# Patient Record
Sex: Male | Born: 1990 | Race: White | Hispanic: No | Marital: Single
Health system: Southern US, Academic
[De-identification: ages and names within clinical notes are randomized; demographics above are authoritative.]

## PROBLEM LIST (undated history)

## (undated) HISTORY — PX: HX EAR TUBES: 2100001170

## (undated) HISTORY — PX: TONSILLECTOMY: SUR1361

---

## 2011-07-02 ENCOUNTER — Emergency Department (HOSPITAL_BASED_OUTPATIENT_CLINIC_OR_DEPARTMENT_OTHER)
Admission: EM | Admit: 2011-07-02 | Discharge: 2011-07-02 | Disposition: A | Discharge status: Home / Self Care | Payer: Self-pay | Attending: Emergency Medicine | Admitting: Emergency Medicine

## 2011-07-02 ENCOUNTER — Encounter (HOSPITAL_BASED_OUTPATIENT_CLINIC_OR_DEPARTMENT_OTHER): Payer: Self-pay | Admitting: *Deleted

## 2011-07-02 DIAGNOSIS — R599 Enlarged lymph nodes, unspecified: Secondary | ICD-10-CM | POA: Insufficient documentation

## 2011-07-02 DIAGNOSIS — K120 Recurrent oral aphthae: Secondary | ICD-10-CM

## 2011-07-02 DIAGNOSIS — K1370 Unspecified lesions of oral mucosa: Secondary | ICD-10-CM | POA: Insufficient documentation

## 2011-07-02 MED ORDER — DEXAMETHASONE SODIUM PHOSPHATE 10 MG/ML IJ SOLN
10.0000 mg | Freq: Once | INTRAMUSCULAR | Status: AC
  Administered 2011-07-02: 10 mg via INTRAMUSCULAR
  Filled 2011-07-02: qty 1

## 2011-07-02 NOTE — ED Notes (Signed)
Pt has blisters on lips and in mouth.

## 2011-07-02 NOTE — Discharge Instructions (Signed)
Canker Sores  Canker sores are painful, open sores on the inside of the mouth and cheek. They may be white or yellow. The sores usually heal in 1 to 2 weeks. Women are more likely than men to have recurrent canker sores. CAUSES The cause of canker sores is not well understood. More than one cause is likely. Canker sores do not appear to be caused by certain types of germs (viruses or bacteria). Canker sores may be caused by:  An allergic reaction to certain foods.   Digestive problems.   Not having enough vitamin B12, folic acid, and iron.   Male sex hormones. Sores may come only during certain phases of a menstrual cycle. Often, there is improvement during pregnancy.   Genetics. Some people seem to inherit canker sore problems.  Emotional stress and injuries to the mouth may trigger outbreaks, but not cause them.  DIAGNOSIS Canker sores are diagnosed by exam.  TREATMENT  Patients who have frequent bouts of canker sores may have cultures taken of the sores, blood tests, or allergy tests. This helps determine if their sores are caused by a poor diet, an allergy, or some other preventable or treatable disease.   Vitamins may prevent recurrences or reduce the severity of canker sores in people with poor nutrition.   Numbing ointments can relieve pain. These are available in drug stores without a prescription.   Anti-inflammatory steroid mouth rinses or gels may be prescribed by your caregiver for severe sores.   Oral steroids may be prescribed if you have severe, recurrent canker sores. These strong medicines can cause many side effects and should be used only under the close direction of a dentist or physician.   Mouth rinses containing the antibiotic medicine may be prescribed. They may lessen symptoms and speed healing.  Healing usually happens in about 1 or 2 weeks with or without treatment. Certain antibiotic mouth rinses given to pregnant women and young children can permanently  stain teeth. Talk to your caregiver about your treatment. HOME CARE INSTRUCTIONS   Avoid foods that cause canker sores for you.   Avoid citrus juices, spicy or salty foods, and coffee until the sores are healed.   Use a soft-bristled toothbrush.   Chew your food carefully to avoid biting your cheek.   Apply topical numbing medicine to the sore to help relieve pain.   Apply a thin paste of baking soda and water to the sore to help heal the sore.   Only use mouth rinses or medicines for pain or discomfort as directed by your caregiver.  SEEK MEDICAL CARE IF:   Your symptoms are not better in 1 week.   Your sores are still present after 2 weeks.   Your sores are very painful.   You have trouble breathing or swallowing.   Your sores come back frequently.  Document Released: 07/11/2010 Document Revised: 03/05/2011 Document Reviewed: 07/11/2010 Exit[employer] Patient Information 2012 [employer]. 

## 2011-07-02 NOTE — ED Provider Notes (Signed)
History     CSN: [id number]  Arrival date & time 07/02/11  2140   First MD Initiated Contact with Patient 07/02/11 2233      Chief Complaint  Patient presents with  . Mouth Lesions    (Consider location/radiation/quality/duration/timing/severity/associated sxs/prior treatment) HPI Comments: Patient complains of lip and mouth lesions that are been ongoing for approximately the last 5 days.  Patient states that 5 days ago he was having fevers to 102 and had a sore throat was seen by a different emergency department and diagnosed with strep pharyngitis and placed on amoxicillin.  Patient's been taking his amoxicillin as prescribed.  Over the last 5 days she's continued to have fevers until today.  He still has a sore throat and has the burning sensation in his throat.  He's taking an over-the-counter acid reflux medicine at this time.  Patient has developed ulcers on his lips growth of his mouth and around his gums that are somewhat painful.  This is also to treat the patient's decreased by mouth intake.  Patient otherwise has had no chest pain, cough, difficulty breathing, nausea or vomiting.  Patient is a 21 y.o. male presenting with mouth sores. The history is provided by the patient. No language interpreter was used.  Mouth Lesions  The current episode started 3 to 5 days ago. Associated symptoms include mouth sores. Pertinent negatives include no fever, no abdominal pain, no nausea, no vomiting, no headaches, no cough, no rash, no eye discharge and no eye redness.    History reviewed. No pertinent past medical history.  Past Surgical History  Procedure Date  . Tonsillectomy     History reviewed. No pertinent family history.  History  Substance Use Topics  . Smoking status: Never Smoker   . Smokeless tobacco: Not on file  . Alcohol Use: No      Review of Systems  Constitutional: Negative.  Negative for fever and chills.  HENT: Positive for mouth sores.   Eyes: Negative.   Negative for discharge and redness.  Respiratory: Negative.  Negative for cough and shortness of breath.   Cardiovascular: Negative.  Negative for chest pain.  Gastrointestinal: Negative.  Negative for nausea, vomiting and abdominal pain.  Genitourinary: Negative.  Negative for hematuria.  Musculoskeletal: Negative.  Negative for back pain.  Skin: Negative.  Negative for color change and rash.  Neurological: Negative for syncope and headaches.  Hematological: Negative.  Negative for adenopathy.  Psychiatric/Behavioral: Negative.  Negative for confusion.  All other systems reviewed and are negative.    Allergies  Sulfa antibiotics  Home Medications   Current Outpatient Rx  Name Route Sig Dispense Refill  . MAGIC MOUTHWASH W/LIDOCAINE Oral Take 2 mLs by mouth 3 (three) times daily.    . AMOXICILLIN 875 MG PO TABS Oral Take 875 mg by mouth 2 (two) times daily.    Marland Kitchen BISMUTH SUBSALICYLATE 262 MG/15ML PO SUSP Oral Take 30 mLs by mouth every 6 (six) hours as needed. Patient used this medication for upset stomach.    . IBUPROFEN 200 MG PO TABS Oral Take 400 mg by mouth every 6 (six) hours as needed. Patient used this medication for fever.    Ronney Asters COLD PO Oral Take 10 mLs by mouth daily as needed. Patient was using this medication for cold symptoms.      BP 137/81  Pulse 93  Temp(Src) 98.3 F (36.8 C) (Oral)  Resp 18  Ht 6\' 2"  (1.88 m)  Wt 220 lb (99.791 kg)  BMI 28.25 kg/m2  SpO2 99%  Physical Exam  Nursing note and vitals reviewed. Constitutional: He is oriented to person, place, and time. He appears well-developed and well-nourished.  Non-toxic appearance. He does not have a sickly appearance.  HENT:  Head: Normocephalic and atraumatic.  Mouth/Throat: No oropharyngeal exudate.       Patient's upper and lower lips have crusted over areas of ulcers.  Around his front upper teeth there is some redness and ulcers along his palate.  Patient has an ulcer on his left buccal mucosa  and along his lower lip oral mucosa.  Eyes: Conjunctivae, EOM and lids are normal. Pupils are equal, round, and reactive to light.  Neck: Trachea normal, normal range of motion and full passive range of motion without pain. Neck supple.  Cardiovascular: Normal rate, regular rhythm and normal heart sounds.   Pulmonary/Chest: Effort normal and breath sounds normal. No respiratory distress.  Abdominal: Soft. Normal appearance. He exhibits no distension. There is no tenderness. There is no rebound and no CVA tenderness.  Musculoskeletal: Normal range of motion.  Lymphadenopathy:    He has cervical adenopathy.  Neurological: He is alert and oriented to person, place, and time. He has normal strength.  Skin: Skin is warm, dry and intact. No rash noted.  Psychiatric: He has a normal mood and affect. His behavior is normal. Judgment and thought content normal.    ED Course  Procedures (including critical care time)  Labs Reviewed - No data to display No results found.   No diagnosis found.    MDM  Patient with likely aphthous ulcers of unclear etiology.  Steroids can sometimes help but as the patient has been diffusely through his mouth and lips I will give him a shot of Decadron here.  Patient otherwise has normal vital signs here.  He is already on amoxicillin for possible strep pharyngitis and his throat does not look significantly swollen and there is no airway compromise at this time.  I will encourage him to continue to drink fluids.        Nat Christen, MD 07/02/11 [phone]

## 2012-03-26 ENCOUNTER — Emergency Department (HOSPITAL_BASED_OUTPATIENT_CLINIC_OR_DEPARTMENT_OTHER): Payer: Self-pay

## 2012-03-26 ENCOUNTER — Emergency Department (HOSPITAL_BASED_OUTPATIENT_CLINIC_OR_DEPARTMENT_OTHER)
Admission: EM | Admit: 2012-03-26 | Discharge: 2012-03-26 | Disposition: A | Discharge status: Home / Self Care | Payer: Self-pay | Attending: Emergency Medicine | Admitting: Emergency Medicine

## 2012-03-26 ENCOUNTER — Encounter (HOSPITAL_BASED_OUTPATIENT_CLINIC_OR_DEPARTMENT_OTHER): Payer: Self-pay | Admitting: *Deleted

## 2012-03-26 DIAGNOSIS — K5900 Constipation, unspecified: Secondary | ICD-10-CM | POA: Insufficient documentation

## 2012-03-26 DIAGNOSIS — R109 Unspecified abdominal pain: Secondary | ICD-10-CM | POA: Insufficient documentation

## 2012-03-26 LAB — URINALYSIS, ROUTINE W REFLEX MICROSCOPIC
Hgb urine dipstick: NEGATIVE
Nitrite: NEGATIVE
Protein, ur: NEGATIVE mg/dL
Specific Gravity, Urine: 1.01 (ref 1.005–1.030)
Urobilinogen, UA: 0.2 mg/dL (ref 0.0–1.0)

## 2012-03-26 MED ORDER — ONDANSETRON HCL 4 MG PO TABS: 4.0000 mg | ORAL_TABLET | Freq: Four times a day (QID) | ORAL | Status: AC

## 2012-03-26 MED ORDER — OXYCODONE-ACETAMINOPHEN 10-325 MG PO TABS: 1.0000 | ORAL_TABLET | ORAL | Status: AC | PRN

## 2012-03-26 NOTE — ED Notes (Signed)
Pt c/o bladder pain x 4 days. Worse with coughing, sitting and standing

## 2012-03-26 NOTE — ED Provider Notes (Signed)
History     CSN: [id number]  Arrival date & time 03/26/12  4098   First MD Initiated Contact with Patient 03/26/12 2107      Chief Complaint  Patient presents with  . Abdominal Pain    (Consider location/radiation/quality/duration/timing/severity/associated sxs/prior treatment) Patient is a 21 y.o. male presenting with abdominal pain. The history is provided by the patient and the spouse. No language interpreter was used.  Abdominal Pain The primary symptoms of the illness include abdominal pain. The primary symptoms of the illness do not include fever, shortness of breath, nausea, vomiting or dysuria.  Additional symptoms associated with the illness include constipation. Symptoms associated with the illness do not include urgency, hematuria or frequency.    History reviewed. No pertinent past medical history.  Past Surgical History  Procedure Date  . Tonsillectomy     History reviewed. No pertinent family history.  History  Substance Use Topics  . Smoking status: Never Smoker   . Smokeless tobacco: Not on file  . Alcohol Use: No      Review of Systems  Constitutional: Negative.  Negative for fever.  HENT: Negative.   Eyes: Negative.   Respiratory: Negative.  Negative for shortness of breath.   Cardiovascular: Negative.   Gastrointestinal: Positive for abdominal pain and constipation. Negative for nausea, vomiting, blood in stool and rectal pain.       Supra pubic pain intermittant with standing and sitting  Genitourinary: Negative for dysuria, urgency, frequency, hematuria, flank pain, discharge, penile swelling, scrotal swelling, difficulty urinating, penile pain and testicular pain.  Neurological: Negative.  Negative for light-headedness.  Psychiatric/Behavioral: Negative.   All other systems reviewed and are negative.    Allergies  Sulfa antibiotics  Home Medications   Current Outpatient Rx  Name  Route  Sig  Dispense  Refill  . MAGIC MOUTHWASH  W/LIDOCAINE   Oral   Take 2 mLs by mouth 3 (three) times daily.         . AMOXICILLIN 875 MG PO TABS   Oral   Take 875 mg by mouth 2 (two) times daily.         Marland Kitchen BISMUTH SUBSALICYLATE 262 MG/15ML PO SUSP   Oral   Take 30 mLs by mouth every 6 (six) hours as needed. Patient used this medication for upset stomach.         . IBUPROFEN 200 MG PO TABS   Oral   Take 400 mg by mouth every 6 (six) hours as needed. Patient used this medication for fever.         Ronney Asters COLD PO   Oral   Take 10 mLs by mouth daily as needed. Patient was using this medication for cold symptoms.           BP 139/77  Pulse 88  Temp 98.4 F (36.9 C) (Oral)  Resp 18  Ht 6\' 2"  (1.88 m)  Wt 238 lb (107.956 kg)  BMI 30.56 kg/m2  SpO2 100%  Physical Exam  Nursing note and vitals reviewed. Constitutional: He is oriented to person, place, and time. He appears well-developed and well-nourished.  HENT:  Head: Normocephalic.  Eyes: Conjunctivae normal and EOM are normal. Pupils are equal, round, and reactive to light.  Neck: Normal range of motion. Neck supple.  Cardiovascular: Normal rate.   Pulmonary/Chest: Effort normal and breath sounds normal. No respiratory distress.  Abdominal: Soft. Bowel sounds are normal. He exhibits no distension. There is no tenderness. There is no rebound and no  guarding.  Genitourinary: Right testis shows no swelling and no tenderness. Left testis shows no swelling and no tenderness.  Musculoskeletal: Normal range of motion.  Neurological: He is alert and oriented to person, place, and time.  Skin: Skin is warm and dry.  Psychiatric: He has a normal mood and affect.    ED Course  Procedures (including critical care time)   Labs Reviewed  URINALYSIS, ROUTINE W REFLEX MICROSCOPIC   Dg Abd 1 View  03/26/2012  *RADIOLOGY REPORT*  Clinical Data: Constipation and pain localizing to the region of the bladder.  ABDOMEN - 1 VIEW  Comparison: None.  Findings: Bowel  gas pattern is normal.  No abnormal calcifications are identified.  Visualized bony structures are unremarkable.  IMPRESSION: Normal abdominal radiograph.   Original Report Authenticated By: Irish Lack, M.D.      No diagnosis found.    MDM  Intermittant suprapubic pain with constipation x 4 days.    No pain on exam.  U/a unremarkable.  KUb normal reviewed by myself.   rx for percocet and zofran.  He understands to return if worsening symptoms.         Remi Haggard, NP 03/27/12 1119

## 2012-03-27 NOTE — ED Provider Notes (Signed)
Medical screening examination/treatment/procedure(s) were performed by non-physician practitioner and as supervising physician I was immediately available for consultation/collaboration.  Sharde Gover K Linker, MD 03/27/12 1504 

## 2014-01-10 ENCOUNTER — Encounter (HOSPITAL_BASED_OUTPATIENT_CLINIC_OR_DEPARTMENT_OTHER): Payer: Self-pay | Admitting: Emergency Medicine

## 2014-01-10 ENCOUNTER — Emergency Department (HOSPITAL_BASED_OUTPATIENT_CLINIC_OR_DEPARTMENT_OTHER)
Admission: EM | Admit: 2014-01-10 | Discharge: 2014-01-10 | Disposition: A | Discharge status: Home / Self Care | Payer: Self-pay | Attending: Emergency Medicine | Admitting: Emergency Medicine

## 2014-01-10 ENCOUNTER — Emergency Department (HOSPITAL_BASED_OUTPATIENT_CLINIC_OR_DEPARTMENT_OTHER): Payer: Self-pay

## 2014-01-10 DIAGNOSIS — Z79899 Other long term (current) drug therapy: Secondary | ICD-10-CM | POA: Insufficient documentation

## 2014-01-10 DIAGNOSIS — Y9289 Other specified places as the place of occurrence of the external cause: Secondary | ICD-10-CM | POA: Insufficient documentation

## 2014-01-10 DIAGNOSIS — S60221A Contusion of right hand, initial encounter: Secondary | ICD-10-CM | POA: Insufficient documentation

## 2014-01-10 DIAGNOSIS — W2209XA Striking against other stationary object, initial encounter: Secondary | ICD-10-CM | POA: Insufficient documentation

## 2014-01-10 DIAGNOSIS — Z720 Tobacco use: Secondary | ICD-10-CM | POA: Insufficient documentation

## 2014-01-10 DIAGNOSIS — Z792 Long term (current) use of antibiotics: Secondary | ICD-10-CM | POA: Insufficient documentation

## 2014-01-10 DIAGNOSIS — Y9389 Activity, other specified: Secondary | ICD-10-CM | POA: Insufficient documentation

## 2014-01-10 NOTE — Discharge Instructions (Signed)
Contusion °A contusion is a deep bruise. Contusions happen when an injury causes bleeding under the skin. Signs of bruising include pain, puffiness (swelling), and discolored skin. The contusion may turn blue, purple, or yellow. °HOME CARE  °· Put ice on the injured area. °¨ Put ice in a plastic bag. °¨ Place a towel between your skin and the bag. °¨ Leave the ice on for 15-20 minutes, 03-04 times a day. °· Only take medicine as told by your doctor. °· Rest the injured area. °· If possible, raise (elevate) the injured area to lessen puffiness. °GET HELP RIGHT AWAY IF:  °· You have more bruising or puffiness. °· You have pain that is getting worse. °· Your puffiness or pain is not helped by medicine. °MAKE SURE YOU:  °· Understand these instructions. °· Will watch your condition. °· Will get help right away if you are not doing well or get worse. °Document Released: 09/02/2007 Document Revised: 06/08/2011 Document Reviewed: 01/19/2011 °[employer]® Patient Information ©2015 [employer]. This information is not intended to replace advice given to you by your health care provider. Make sure you discuss any questions you have with your health care provider. ° °

## 2014-01-10 NOTE — ED Notes (Signed)
Punched a chair yesterday.  Swelling to back of right hand.

## 2014-01-13 NOTE — ED Provider Notes (Signed)
CSN: [id number]     Arrival date & time 01/10/14  1723 History   First MD Initiated Contact with Patient 01/10/14 1759     Chief Complaint  Patient presents with  . Hand Injury      HPI  Patient presents for evaluation 24 hours after punching a chair. Swelling of the dorsum of the right hand. Some pain. Is able to use and move the hand.  History reviewed. No pertinent past medical history. Past Surgical History  Procedure Laterality Date  . Tonsillectomy     No family history on file. History  Substance Use Topics  . Smoking status: Current Every Day Smoker -- 1.00 packs/day  . Smokeless tobacco: Not on file  . Alcohol Use: Yes     Comment: occ    Review of Systems  Constitutional: Negative for fever, chills, diaphoresis, appetite change and fatigue.  HENT: Negative for mouth sores, sore throat and trouble swallowing.   Eyes: Negative for visual disturbance.  Respiratory: Negative for cough, chest tightness, shortness of breath and wheezing.   Cardiovascular: Negative for chest pain.  Gastrointestinal: Negative for nausea, vomiting, abdominal pain, diarrhea and abdominal distention.  Endocrine: Negative for polydipsia, polyphagia and polyuria.  Genitourinary: Negative for dysuria, frequency and hematuria.  Musculoskeletal: Negative for gait problem.  Skin: Negative for color change, pallor and rash.  Neurological: Negative for dizziness, syncope, light-headedness and headaches.  Hematological: Does not bruise/bleed easily.  Psychiatric/Behavioral: Negative for behavioral problems and confusion.      Allergies  Sulfa antibiotics  Home Medications   Prior to Admission medications   Medication Sig Start Date End Date Taking? Authorizing Provider  Alum & Mag Hydroxide-Simeth (MAGIC MOUTHWASH W/LIDOCAINE) SOLN Take 2 mLs by mouth 3 (three) times daily.    Historical Provider, MD  amoxicillin (AMOXIL) 875 MG tablet Take 875 mg by mouth 2 (two) times daily.     Historical Provider, MD  bismuth subsalicylate (PEPTO BISMOL) 262 MG/15ML suspension Take 30 mLs by mouth every 6 (six) hours as needed. Patient used this medication for upset stomach.    Historical Provider, MD  ibuprofen (ADVIL,MOTRIN) 200 MG tablet Take 400 mg by mouth every 6 (six) hours as needed. Patient used this medication for fever.    Historical Provider, MD  ondansetron (ZOFRAN) 4 MG tablet Take 1 tablet (4 mg total) by mouth every 6 (six) hours. 03/26/12   Remi HaggardAnne Crawford, NP  oxyCODONE-acetaminophen (PERCOCET) 10-325 MG per tablet Take 1 tablet by mouth every 4 (four) hours as needed for pain. 03/26/12   Remi HaggardAnne Crawford, NP  Pseudoeph-CPM-DM-APAP (TYLENOL COLD PO) Take 10 mLs by mouth daily as needed. Patient was using this medication for cold symptoms.    Historical Provider, MD   BP 118/60  Pulse 65  Temp(Src) 98.6 F (37 C) (Oral)  Resp 18  Ht 6\' 2"  (1.88 m)  Wt 165 lb (74.844 kg)  BMI 21.18 kg/m2  SpO2 98% Physical Exam  Musculoskeletal:       Hands: STS.  Full range of motion. Normal sensation. Scan. Normal range of motion of the wrist.    ED Course  Procedures (including critical care time) Labs Review Labs Reviewed - No data to display  Imaging Review No results found.   EKG Interpretation None      MDM   Final diagnoses:  Contusion, hand, right, initial encounter    Soft tissue swelling noted. No fracture noted. Plan is expectant management, ice, Motrin.    Rolland PorterMark Darnette Lampron, MD 01/13/14  0729 

## 2016-05-15 IMAGING — CR DG HAND COMPLETE 3+V*R*
3 series · 3 of 3 positions shown · non-contrast
Comparison: None.

CLINICAL DATA: Patient with injury to the hand.

EXAM:
RIGHT HAND - COMPLETE 3+ VIEW

[x hand pa right]
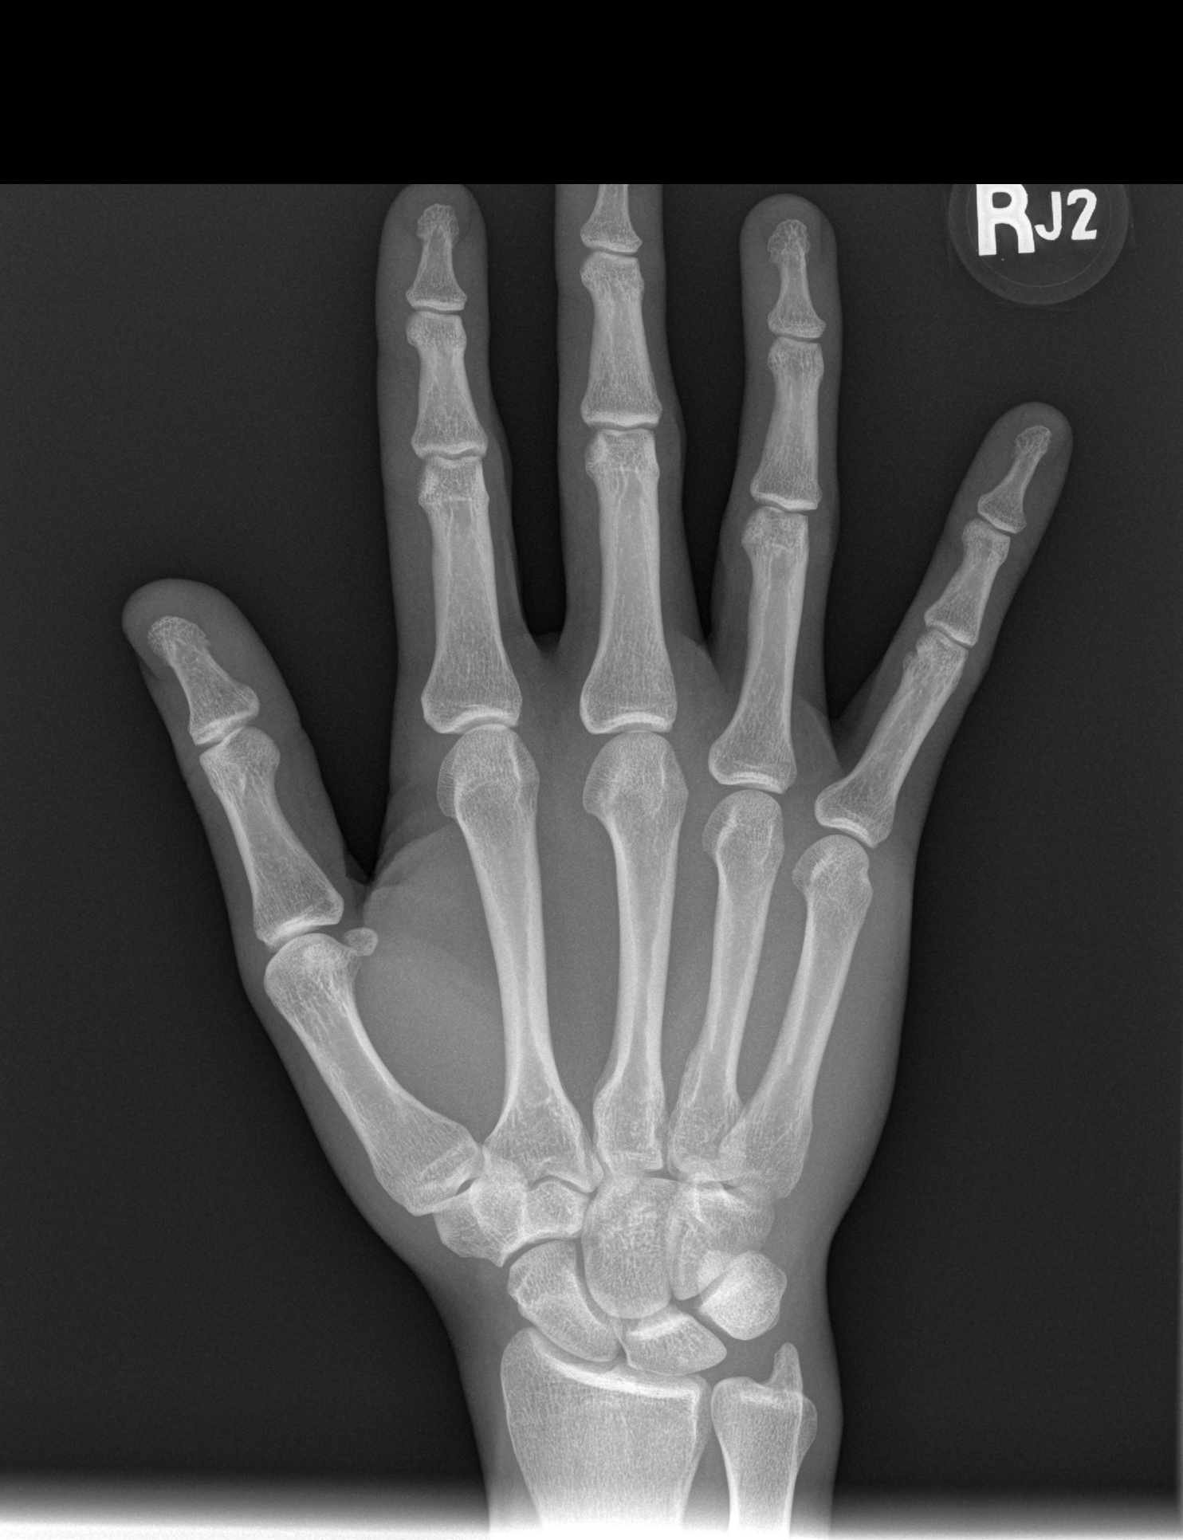

[x hand oblique right]
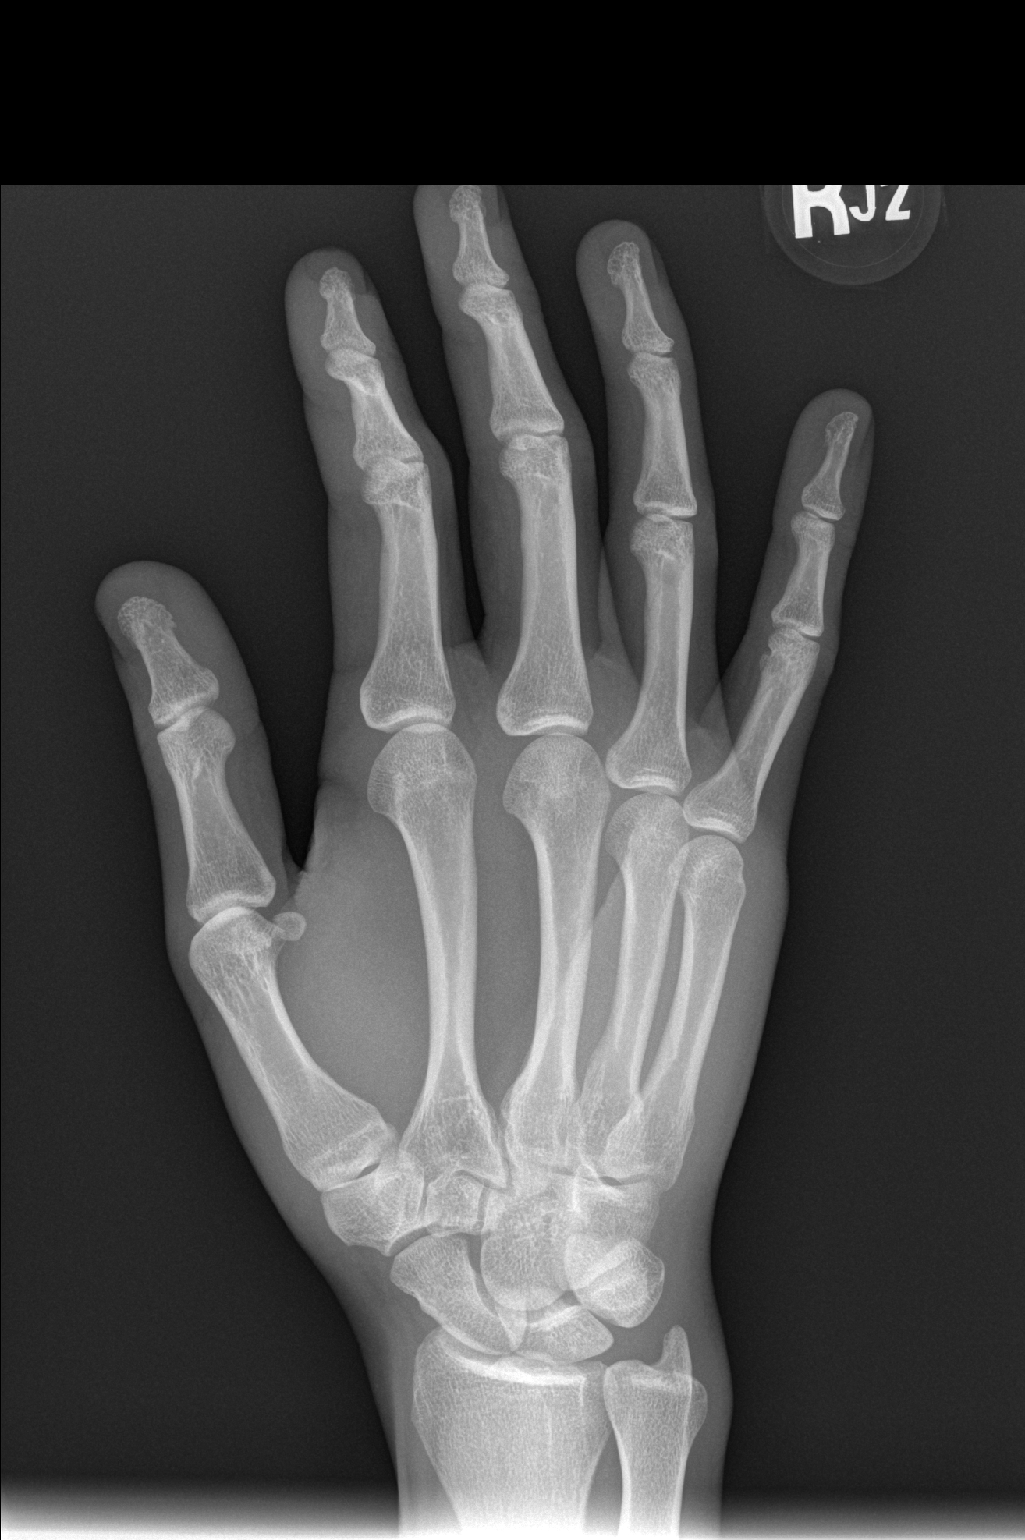

[x hand lat right]
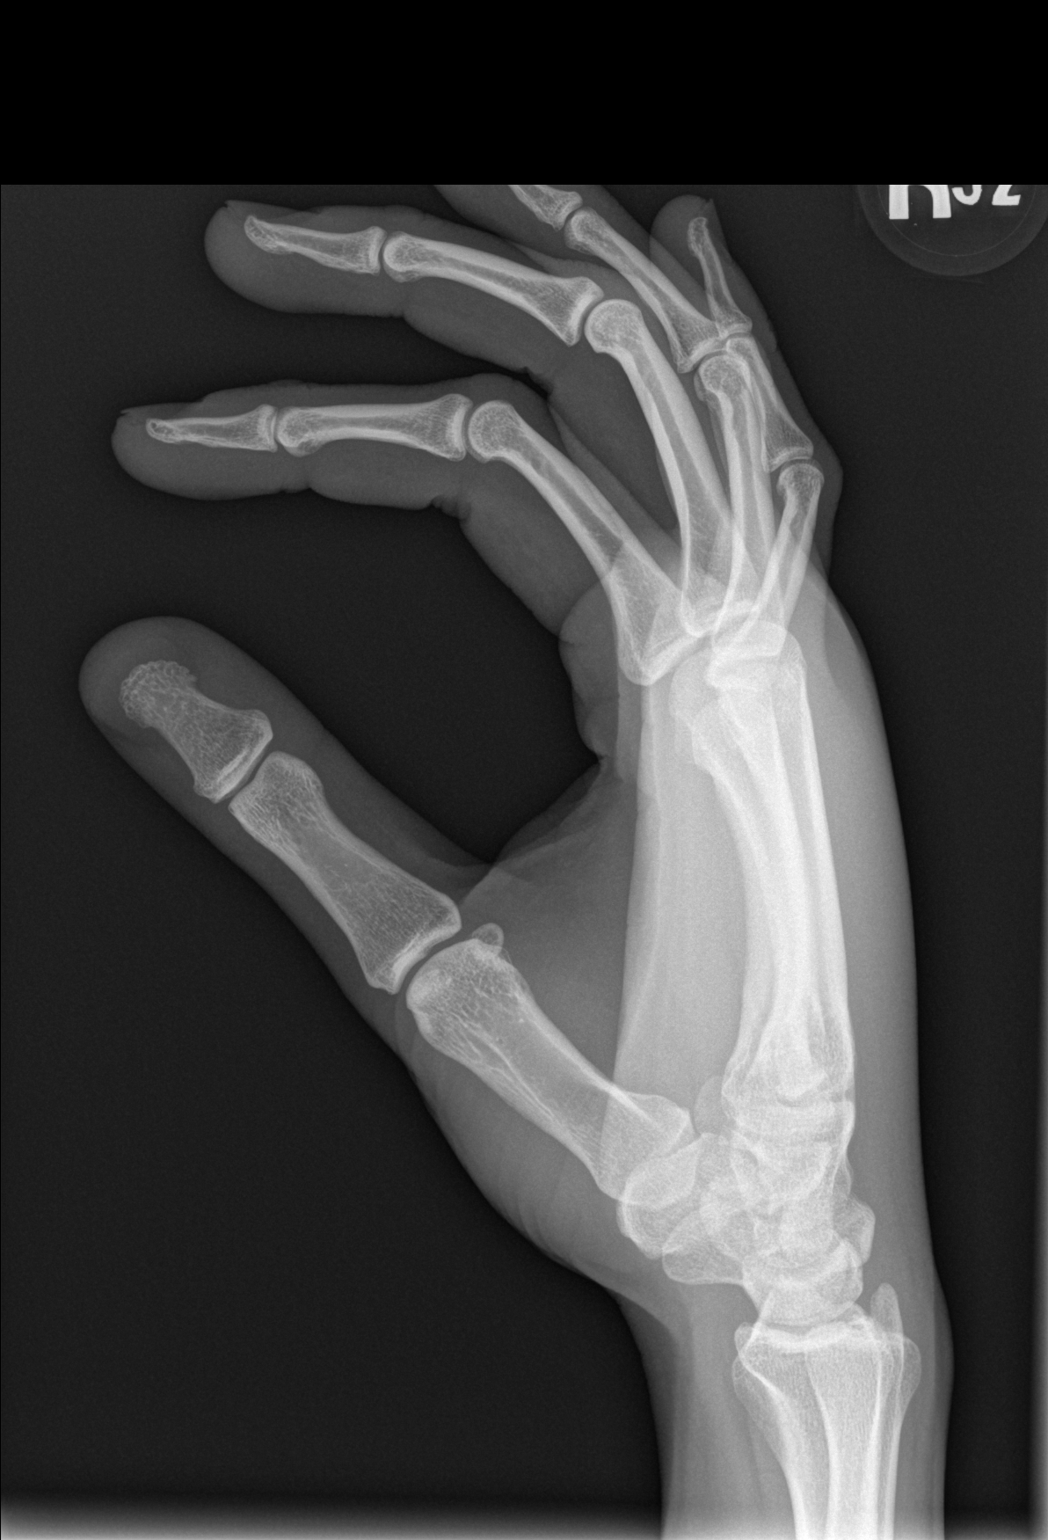

[3 of 3 positions shown; findings below may reference images not displayed]

FINDINGS: Normal anatomic alignment. No evidence for acute fracture or
dislocation. Regional soft tissues unremarkable.
IMPRESSION: No acute osseous abnormality.

## 2017-09-30 ENCOUNTER — Encounter (HOSPITAL_COMMUNITY): Payer: Self-pay

## 2017-09-30 ENCOUNTER — Emergency Department
Admission: EM | Admit: 2017-09-30 | Discharge: 2017-09-30 | Disposition: A | Discharge status: Home / Self Care | Payer: Self-pay | Attending: EMERGENCY MEDICINE | Admitting: EMERGENCY MEDICINE

## 2017-09-30 ENCOUNTER — Other Ambulatory Visit: Payer: Self-pay

## 2017-09-30 DIAGNOSIS — K0889 Other specified disorders of teeth and supporting structures: Secondary | ICD-10-CM

## 2017-09-30 MED ORDER — CLINDAMYCIN HCL 150 MG CAPSULE
300.00 mg | ORAL_CAPSULE | Freq: Three times a day (TID) | ORAL | 0 refills | Status: AC
Start: 2017-09-30 — End: 2017-10-10

## 2017-09-30 MED ORDER — KETOROLAC 60 MG/2 ML INTRAMUSCULAR SOLUTION
60.00 mg | INTRAMUSCULAR | Status: AC
Start: 2017-09-30 — End: 2017-09-30
  Administered 2017-09-30: 60 mg via INTRAMUSCULAR
  Filled 2017-09-30: qty 2

## 2017-09-30 MED ORDER — NAPROXEN 500 MG TABLET
500.00 mg | ORAL_TABLET | Freq: Two times a day (BID) | ORAL | 0 refills | Status: AC | PRN
Start: 2017-09-30 — End: ?

## 2017-09-30 MED ORDER — CLINDAMYCIN HCL 150 MG CAPSULE
300.00 mg | ORAL_CAPSULE | ORAL | Status: AC
Start: 2017-09-30 — End: 2017-09-30
  Administered 2017-09-30: 300 mg via ORAL
  Filled 2017-09-30: qty 2

## 2017-09-30 NOTE — ED Provider Notes (Signed)
Department of Emergency Medicine  [facility]    09/30/2017      Patient is a 27 y.o.  male presenting to the ED with chief complaint of toothache.  Patient states that he has had a "bad tooth" for a long time.  He says that he has [place] have [place] but he does not have Secretary/administratordental insurance.  He says that it started hurting 2 days ago and has been hurting ever since..     Location:  Upper left molar  Quality:  Throbbing  Onset:  2-3 days  Severity:  Moderate to severe  Timing:   Context:   Modifying factors:   Associated symptoms:     Review of Systems:    Constitutional: No fever, chills  Skin: No rashes or lesions  MSK: No joint pain.  No neck or back pain  Neuro: No numbness, tingling, or weakness.  Psych: No SI or HI. Normal mood      Past Medical History:  Past Medical History  No current outpatient medications on file.     Allergies   Allergen Reactions   . Amoxicillin Shortness of Breath   . Sulfa (Sulfonamides) Shortness of Breath     History reviewed. No pertinent past medical history.  Past Medical History was reviewed and is negative for .    Past Surgical History:   Procedure Laterality Date   . HX EAR TUBES Bilateral          Family Medical History:     None                 Above history reviewed with patient.  Allergies, medication list, and old records also reviewed.     Filed Vitals:    09/30/17 1728   BP: 118/72   Pulse: 72   Resp: 18   Temp: 36.9 C (98.4 F)   SpO2: 98%       Physical Exam:     Nursing note and vitals reviewed.  Vital signs reviewed as above. No acute distress.   Constitutional: Pt is well-developed and well-nourished.   Head: Normocephalic and atraumatic.   Eyes: Conjunctivae are normal. Pupils are equal, round, and reactive to light. EOM are intact  Mouth:  Patient has a broken left upper molar with obvious caries and erythema of the gum.  This area is tender to percussion.  Neck: Soft, supple, full range of  motion.  Pulmonary/Chest: No respiratory distress  Musculoskeletal: Normal range of motion. No deformities.  Neurological: CNs 2-12 grossly intact.  No focal deficits noted.  Skin: No rash or lesions  Psychiatric: Patient has a normal mood and affect.     Workup:     Labs:  No results found for this or any previous visit (from the past 24 hour(s)).    Imaging:         No orders of the defined types were placed in this encounter.      Abnormal Lab results:  Labs Reviewed - No data to display      Plan: Appropriate labs and imaging ordered. Medical Records reviewed.    MDM:   During the patient's stay in the emergency department, the above listed imaging and/or labs were performed to assist with medical decision making and were reviewed by myself when available for review.     Pt remained stable throughout the emergency department course.      Impression:  Toothache  Plan:  We will put the patient on clindamycin 300 mg t.i.d. For 10 days will also give the patient Naprosyn 500 mg b.i.d. P.r.n. pain the patient is scheduled to return to his home in [place] tomorrow have recommended that he see a dentist as soon as he returns home.

## 2017-09-30 NOTE — ED Triage Notes (Signed)
C/o pain in left upper, back molar since yesterday.

## 2017-09-30 NOTE — ED Nurses Note (Signed)
No acute distress at dc   Verbalized understanding of dc instructions

## 2017-09-30 NOTE — Discharge Instructions (Signed)
See a Dentist for further care as soon as you return home from your vacation.
